# Patient Record
Sex: Male | Born: 1990 | Race: White | Hispanic: No | Marital: Single | Smoking: Never smoker
Health system: Southern US, Community
[De-identification: ages and names within clinical notes are randomized; demographics above are authoritative.]

## PROBLEM LIST (undated history)

## (undated) DIAGNOSIS — N200 Calculus of kidney: Secondary | ICD-10-CM

## (undated) DIAGNOSIS — I10 Essential (primary) hypertension: Secondary | ICD-10-CM

## (undated) HISTORY — PX: EXTRACORPOREAL SHOCK WAVE LITHOTRIPSY: SHX1557

---

## 2011-01-03 ENCOUNTER — Emergency Department: Payer: Self-pay | Admitting: *Deleted

## 2012-03-08 ENCOUNTER — Ambulatory Visit: Payer: Self-pay | Admitting: Urology

## 2012-03-17 ENCOUNTER — Ambulatory Visit: Payer: Self-pay | Admitting: Urology

## 2013-01-31 ENCOUNTER — Emergency Department: Payer: Self-pay | Admitting: Emergency Medicine

## 2013-01-31 LAB — URINALYSIS, COMPLETE
Glucose,UR: NEGATIVE mg/dL (ref 0–75)
Leukocyte Esterase: NEGATIVE
Nitrite: NEGATIVE
Ph: 6 (ref 4.5–8.0)
Specific Gravity: 1.035 (ref 1.003–1.030)

## 2013-01-31 LAB — BASIC METABOLIC PANEL
Anion Gap: 4 — ABNORMAL LOW (ref 7–16)
BUN: 12 mg/dL (ref 7–18)
Glucose: 96 mg/dL (ref 65–99)
Potassium: 4.3 mmol/L (ref 3.5–5.1)
Sodium: 138 mmol/L (ref 136–145)

## 2014-06-03 ENCOUNTER — Ambulatory Visit: Payer: Self-pay | Admitting: Physician Assistant

## 2015-03-06 ENCOUNTER — Encounter: Payer: Self-pay | Admitting: Physician Assistant

## 2015-03-06 ENCOUNTER — Ambulatory Visit: Payer: Self-pay | Admitting: Physician Assistant

## 2015-03-06 VITALS — BP 145/90 | HR 116 | Temp 98.6°F

## 2015-03-06 DIAGNOSIS — J069 Acute upper respiratory infection, unspecified: Secondary | ICD-10-CM

## 2015-03-06 MED ORDER — AMOXICILLIN 875 MG PO TABS
875.0000 mg | ORAL_TABLET | Freq: Two times a day (BID) | ORAL | Status: DC
Start: 2015-03-06 — End: 2015-07-09

## 2015-03-06 NOTE — Progress Notes (Signed)
S: C/o runny nose and congestion for 7-10 days, ? fever, chills, felt hot last night, denies cp/sob, v/d; mucus is yellow and thick, cough is sporadic, c/o of facial and dental pain.   Using otc meds:   O: PE: perrl eomi, normocephalic, tms dull, nasal mucosa red and swollen, throat injected, tonsils swollen b/l, neck supple no lymph, lungs c t a, cv rrr, neuro intact, pt feels warm and clammy  A:  Acute sinusitis   P: amoxil 875mg  bid x 10d, otc meds for fever and cold sx; drink fluids, continue regular meds , return if not improving in 5 days, return earlier if worsening

## 2015-07-09 ENCOUNTER — Encounter: Payer: Self-pay | Admitting: Physician Assistant

## 2015-07-09 ENCOUNTER — Ambulatory Visit: Payer: Self-pay | Admitting: Physician Assistant

## 2015-07-09 VITALS — BP 120/80 | HR 80 | Temp 98.3°F

## 2015-07-09 DIAGNOSIS — N200 Calculus of kidney: Secondary | ICD-10-CM

## 2015-07-09 LAB — POCT URINALYSIS DIPSTICK
BILIRUBIN UA: NEGATIVE
GLUCOSE UA: NEGATIVE
Ketones, UA: NEGATIVE
Leukocytes, UA: NEGATIVE
Nitrite, UA: NEGATIVE
Protein, UA: NEGATIVE
SPEC GRAV UA: 1.025
Urobilinogen, UA: 0.2
pH, UA: 6

## 2015-07-09 MED ORDER — SILODOSIN 8 MG PO CAPS: 8.0000 mg | ORAL_CAPSULE | Freq: Every day | ORAL | Status: AC

## 2015-07-09 MED ORDER — OXYCODONE-ACETAMINOPHEN 5-325 MG PO TABS: 1.0000 | ORAL_TABLET | Freq: Three times a day (TID) | ORAL | Status: AC | PRN

## 2015-07-09 NOTE — Progress Notes (Signed)
S: states is having flank pain due to kidney stone, hx of same, has several large ones that need to pass, passed a piece of one a few days ago, still having pain, is followed by dr Sheppard Pentonwolf, is running out of rapid flow and pain meds  O: vitals wnl, nad, slight flank tenderness, ua 2+ blood  A: kidney stone  P: rapidflow 8mg  qd, percocet 5/325 #20 nr; f/u with dr Sheppard Pentonwolf

## 2015-07-13 ENCOUNTER — Emergency Department: Payer: Managed Care, Other (non HMO)

## 2015-07-13 ENCOUNTER — Encounter: Payer: Self-pay | Admitting: Radiology

## 2015-07-13 ENCOUNTER — Emergency Department
Admission: EM | Admit: 2015-07-13 | Discharge: 2015-07-13 | Disposition: A | Discharge status: Home / Self Care | Payer: Managed Care, Other (non HMO) | Attending: Emergency Medicine | Admitting: Emergency Medicine

## 2015-07-13 DIAGNOSIS — N200 Calculus of kidney: Secondary | ICD-10-CM

## 2015-07-13 DIAGNOSIS — R109 Unspecified abdominal pain: Secondary | ICD-10-CM | POA: Diagnosis present

## 2015-07-13 LAB — CBC WITH DIFFERENTIAL/PLATELET
BASOS ABS: 0.1 10*3/uL (ref 0–0.1)
Basophils Relative: 1 %
Eosinophils Absolute: 0.2 10*3/uL (ref 0–0.7)
Eosinophils Relative: 2 %
HEMATOCRIT: 42.6 % (ref 40.0–52.0)
Hemoglobin: 14.5 g/dL (ref 13.0–18.0)
LYMPHS ABS: 2.8 10*3/uL (ref 1.0–3.6)
LYMPHS PCT: 26 %
MCH: 27.9 pg (ref 26.0–34.0)
MCHC: 33.9 g/dL (ref 32.0–36.0)
MCV: 82.3 fL (ref 80.0–100.0)
MONO ABS: 1.1 10*3/uL — AB (ref 0.2–1.0)
MONOS PCT: 10 %
NEUTROS ABS: 6.7 10*3/uL — AB (ref 1.4–6.5)
Neutrophils Relative %: 61 %
Platelets: 378 10*3/uL (ref 150–440)
RBC: 5.18 MIL/uL (ref 4.40–5.90)
RDW: 13.9 % (ref 11.5–14.5)
WBC: 10.8 10*3/uL — ABNORMAL HIGH (ref 3.8–10.6)

## 2015-07-13 LAB — URINALYSIS COMPLETE WITH MICROSCOPIC (ARMC ONLY)
BILIRUBIN URINE: NEGATIVE
Bacteria, UA: NONE SEEN
GLUCOSE, UA: NEGATIVE mg/dL
KETONES UR: NEGATIVE mg/dL
Leukocytes, UA: NEGATIVE
NITRITE: NEGATIVE
Protein, ur: 30 mg/dL — AB
SPECIFIC GRAVITY, URINE: 1.025 (ref 1.005–1.030)
Squamous Epithelial / LPF: NONE SEEN
pH: 5 (ref 5.0–8.0)

## 2015-07-13 LAB — BASIC METABOLIC PANEL
ANION GAP: 10 (ref 5–15)
BUN: 13 mg/dL (ref 6–20)
CALCIUM: 9 mg/dL (ref 8.9–10.3)
CO2: 21 mmol/L — AB (ref 22–32)
Chloride: 100 mmol/L — ABNORMAL LOW (ref 101–111)
Creatinine, Ser: 0.85 mg/dL (ref 0.61–1.24)
GFR calc Af Amer: >60 mL/min (ref >60)
GFR calc non Af Amer: >60 mL/min (ref >60)
GLUCOSE: 96 mg/dL (ref 65–99)
Potassium: 3.7 mmol/L (ref 3.5–5.1)
Sodium: 131 mmol/L — ABNORMAL LOW (ref 135–145)

## 2015-07-13 MED ORDER — MORPHINE SULFATE (PF) 4 MG/ML IV SOLN
4.0000 mg | Freq: Once | INTRAVENOUS | Status: AC
  Administered 2015-07-13: 4 mg via INTRAVENOUS
  Filled 2015-07-13: qty 1

## 2015-07-13 MED ORDER — ONDANSETRON HCL 4 MG/2ML IJ SOLN
4.0000 mg | Freq: Once | INTRAMUSCULAR | Status: AC
  Administered 2015-07-13: 4 mg via INTRAVENOUS
  Filled 2015-07-13: qty 2

## 2015-07-13 MED ORDER — ONDANSETRON HCL 4 MG PO TABS: 4.0000 mg | ORAL_TABLET | Freq: Three times a day (TID) | ORAL | Status: DC | PRN

## 2015-07-13 MED ORDER — SODIUM CHLORIDE 0.9 % IV BOLUS (SEPSIS)
1000.0000 mL | Freq: Once | INTRAVENOUS | Status: AC
  Administered 2015-07-13: 1000 mL via INTRAVENOUS

## 2015-07-13 NOTE — Discharge Instructions (Signed)
Please seek medical attention for any high fevers, chest pain, shortness of breath, change in behavior, persistent vomiting, bloody stool or any other new or concerning symptoms. ° ° °Kidney Stones °Kidney stones (urolithiasis) are solid masses that form inside your kidneys. The intense pain is caused by the stone moving through the kidney, ureter, bladder, and urethra (urinary tract). When the stone moves, the ureter starts to spasm around the stone. The stone is usually passed in your pee (urine).  °HOME CARE °· Drink enough fluids to keep your pee clear or pale yellow. This helps to get the stone out. °· Take a 24-hour pee (urine) sample as told by your doctor. You may need to take another sample every 6-12 months. °· Strain all pee through the provided strainer. Do not pee without peeing through the strainer, not even once. If you pee the stone out, catch it in the strainer. The stone may be as small as a grain of salt. Take this to your doctor. This will help your doctor figure out what you can do to try to prevent more kidney stones. °· Only take medicine as told by your doctor. °· Make changes to your daily diet as told by your doctor. You may be told to: °¨ Limit how much salt you eat. °¨ Eat 5 or more servings of fruits and vegetables each day. °¨ Limit how much meat, poultry, fish, and eggs you eat. °· Keep all follow-up visits as told by your doctor. This is important. °· Get follow-up X-rays as told by your doctor. °GET HELP IF: °You have pain that gets worse even if you have been taking pain medicine. °GET HELP RIGHT AWAY IF:  °· Your pain does not get better with medicine. °· You have a fever or shaking chills. °· Your pain increases and gets worse over 18 hours. °· You have new belly (abdominal) pain. °· You feel faint or pass out. °· You are unable to pee. °  °This information is not intended to replace advice given to you by your health care provider. Make sure you discuss any questions you have  with your health care provider. °  °Document Released: 09/09/2007 Document Revised: 12/12/2014 Document Reviewed: 08/24/2012 °Elsevier Interactive Patient Education ©2016 [employer] ° °

## 2015-07-13 NOTE — ED Notes (Signed)
Pt transported to xray 

## 2015-07-13 NOTE — ED Provider Notes (Signed)
[facility] Emergency Department Provider Note    ____________________________________________  Time seen: ~0730  I have reviewed the triage vital signs and the nursing notes.   HISTORY  Chief Complaint Flank Pain   History limited by: Not Limited   HPI Steve Atkinson is a 25 y.o. male history of kidney stones who presents to the emergency department today with left flank pain. It started 6 days ago. It has been located in the left flank. It comes and goes. He saw a walk-in clinic and was given pain medication and Rapaflo. He states that this morning he started having nausea and vomiting was not able to keep his pain medications down. He states he has had multiple kidney stones in the past. Sees Dr. Evelene Croon with urology. He states the past 2 stones have required lithotripsy. He denies any fevers.   No past medical history on file.  There are no active problems to display for this patient.   No past surgical history on file.  Current Outpatient Rx  Name  Route  Sig  Dispense  Refill  . oxyCODONE-acetaminophen (ROXICET) 5-325 MG tablet   Oral   Take 1 tablet by mouth every 8 (eight) hours as needed for severe pain.   20 tablet   0     rx written   . silodosin (RAPAFLO) 8 MG CAPS capsule   Oral   Take 1 capsule (8 mg total) by mouth daily with breakfast.   30 capsule   5     rx written   . tapentadol (NUCYNTA) 50 MG TABS tablet   Oral   Take 50 mg by mouth.           Allergies Review of patient's allergies indicates no known allergies.  No family history on file.  Social History Social History  Substance Use Topics  . Smoking status: Never Smoker   . Smokeless tobacco: Not on file  . Alcohol Use: Not on file  Works in the [employer].   Review of Systems  Constitutional: Negative for fever. Cardiovascular: Negative for chest pain. Respiratory: Negative for shortness of breath. Gastrointestinal: Positive for left  flank pain Neurological: Negative for headaches, focal weakness or numbness.  10-point ROS otherwise negative.  ____________________________________________   PHYSICAL EXAM:  VITAL SIGNS: ED Triage Vitals  Enc Vitals Group     BP 07/13/15 0706 138/76 mmHg     Pulse Rate 07/13/15 0706 79     Resp 07/13/15 0706 18     Temp 07/13/15 0706 98.1 F (36.7 C)     Temp Source 07/13/15 0706 Oral     SpO2 07/13/15 0706 96 %     Weight 07/13/15 0706 240 lb (108.863 kg)     Height 07/13/15 0706  (1.753 m)     Head Cir --      Peak Flow --      Pain Score 07/13/15 0708 8   Constitutional: Alert and oriented. Well appearing and in no distress. Eyes: Conjunctivae are normal. PERRL. Normal extraocular movements. ENT   Head: Normocephalic and atraumatic.   Nose: No congestion/rhinnorhea.   Mouth/Throat: Mucous membranes are moist.   Neck: No stridor. Hematological/Lymphatic/Immunilogical: No cervical lymphadenopathy. Cardiovascular: Normal rate, regular rhythm.  No murmurs, rubs, or gallops. Respiratory: Normal respiratory effort without tachypnea nor retractions. Breath sounds are clear and equal bilaterally. No wheezes/rales/rhonchi. Gastrointestinal: Soft and nontender. No distention.  Genitourinary: Deferred Musculoskeletal: Normal range of motion in all extremities. No joint effusions.  No lower extremity tenderness nor edema. Neurologic:  Normal speech and language. No gross focal neurologic deficits are appreciated.  Skin:  Skin is warm, dry and intact. No rash noted. Psychiatric: Mood and affect are normal. Speech and behavior are normal. Patient exhibits appropriate insight and judgment.  ____________________________________________    LABS (pertinent positives/negatives)  Labs Reviewed  URINALYSIS COMPLETEWITH MICROSCOPIC ([facility] ONLY) - Abnormal; Notable for the following:    Color, Urine YELLOW (*)    APPearance CLEAR (*)    Hgb urine dipstick 3+ (*)     Protein, ur 30 (*)    All other components within normal limits  CBC WITH DIFFERENTIAL/PLATELET - Abnormal; Notable for the following:    WBC 10.8 (*)    Neutro Abs 6.7 (*)    Monocytes Absolute 1.1 (*)    All other components within normal limits  BASIC METABOLIC PANEL - Abnormal; Notable for the following:    Sodium 131 (*)    Chloride 100 (*)    CO2 21 (*)    All other components within normal limits     ____________________________________________   EKG  None  ____________________________________________    RADIOLOGY  KUB IMPRESSION: 1. Suggested tiny stones in the kidneys. No ureteral stones Identified.  CT renal IMPRESSION: There is a 3.2 mm stone in the distal left ureter resulting in minimal hydronephrosis. Other stones are seen in both kidneys with 3 on the left and 1 on the right.   ____________________________________________   PROCEDURES  Procedure(s) performed: None  Critical Care performed: No  ____________________________________________   INITIAL IMPRESSION / ASSESSMENT AND PLAN / ED COURSE  Pertinent labs & imaging results that were available during my care of the patient were reviewed by me and considered in my medical decision making (see chart for details). Patient presented to the emergency department today because of left flank pain. Patient does have a history of kidney stones and feels this is similar. Saw urgent care earlier in the week and had a urinalysis done which showed multiple red blood cells. KUB here did not show any ureteral stone. I did have a discussion with the patient about risk and benefits of CT scan. Patient didn't want a CT scan be performed to evaluate for ureteral stone. I discussed risk of cancer, patient verbalizes understanding and wish to proceed with CT scan. CT scan showed a roughly 3.2 mm stone. I discussed with the patient that this should be able to pass on its own. He can follow up with urology. Will give  patient antibiotics. Patient still has Percocet from urgent care visit.  ____________________________________________   FINAL CLINICAL IMPRESSION(S) / ED DIAGNOSES  Final diagnoses:  Left flank pain  Kidney stone      [name], MD 07/13/15 [phone]

## 2015-07-13 NOTE — ED Notes (Signed)
Pt reports left flank pain that started Monday. Pt was seen Monday and diagnosed with Kidney stone. Unable to keep pain mediations down due to vomiting.

## 2015-07-15 ENCOUNTER — Encounter: Payer: Self-pay | Admitting: Physician Assistant

## 2015-07-15 ENCOUNTER — Ambulatory Visit: Payer: Self-pay | Admitting: Physician Assistant

## 2015-07-15 VITALS — BP 130/80 | HR 75 | Temp 98.2°F

## 2015-07-15 DIAGNOSIS — N200 Calculus of kidney: Secondary | ICD-10-CM

## 2015-07-15 NOTE — Progress Notes (Signed)
S: c/o flank pain, seen in ER for flank pain on Saturday, ct showed multiple kidney stones, was seen in clinic on Thursday for same given rapaflo and percocet, pt is still having intermittent pain and ?if can get pain medication  O:vitals wnl, nad, lungs c t a, cv rrr  A: kidney stone  P: f/u with urology, unable to rx more pain medications due to clinic guidelines; reviewed ct report with patient, has not gotten any other pain meds from another provider [place]

## 2015-07-30 NOTE — Addendum Note (Signed)
Addended by: Catha BrowEACON, MONIQUE T on: 07/30/2015 03:05 PM   Modules accepted: Orders

## 2015-07-31 ENCOUNTER — Encounter: Payer: Self-pay | Admitting: *Deleted

## 2015-08-01 ENCOUNTER — Encounter
Admission: RE | Disposition: A | Discharge status: Home / Self Care | Payer: Self-pay | Source: Ambulatory Visit | Attending: Urology

## 2015-08-01 ENCOUNTER — Encounter: Payer: Self-pay | Admitting: *Deleted

## 2015-08-01 ENCOUNTER — Ambulatory Visit
Admission: RE | Admit: 2015-08-01 | Discharge: 2015-08-01 | Disposition: A | Discharge status: Home / Self Care | Payer: Managed Care, Other (non HMO) | Source: Ambulatory Visit | Attending: Urology | Admitting: Urology

## 2015-08-01 DIAGNOSIS — F17200 Nicotine dependence, unspecified, uncomplicated: Secondary | ICD-10-CM | POA: Diagnosis not present

## 2015-08-01 DIAGNOSIS — N200 Calculus of kidney: Secondary | ICD-10-CM | POA: Insufficient documentation

## 2015-08-01 DIAGNOSIS — Z79899 Other long term (current) drug therapy: Secondary | ICD-10-CM | POA: Diagnosis not present

## 2015-08-01 DIAGNOSIS — Z8249 Family history of ischemic heart disease and other diseases of the circulatory system: Secondary | ICD-10-CM | POA: Insufficient documentation

## 2015-08-01 HISTORY — DX: Essential (primary) hypertension: I10

## 2015-08-01 HISTORY — PX: EXTRACORPOREAL SHOCK WAVE LITHOTRIPSY: SHX1557

## 2015-08-01 HISTORY — DX: Calculus of kidney: N20.0

## 2015-08-01 SURGERY — LITHOTRIPSY, ESWL
Anesthesia: Moderate Sedation | Laterality: Left

## 2015-08-01 MED ORDER — FUROSEMIDE 10 MG/ML IJ SOLN: 10.0000 mg | Freq: Once | INTRAMUSCULAR | Status: DC

## 2015-08-01 MED ORDER — MIDAZOLAM HCL 2 MG/2ML IJ SOLN
INTRAMUSCULAR | Status: AC
  Administered 2015-08-01: 1 mg via INTRAMUSCULAR
  Filled 2015-08-01: qty 2

## 2015-08-01 MED ORDER — DIPHENHYDRAMINE HCL 25 MG PO CAPS
ORAL_CAPSULE | ORAL | Status: AC
  Administered 2015-08-01: 25 mg via ORAL
  Filled 2015-08-01: qty 1

## 2015-08-01 MED ORDER — MIDAZOLAM HCL 2 MG/2ML IJ SOLN
1.0000 mg | Freq: Once | INTRAMUSCULAR | Status: AC
  Administered 2015-08-01: 1 mg via INTRAMUSCULAR

## 2015-08-01 MED ORDER — MORPHINE SULFATE (PF) 10 MG/ML IV SOLN
10.0000 mg | Freq: Once | INTRAVENOUS | Status: AC
  Administered 2015-08-01: 10 mg via INTRAMUSCULAR

## 2015-08-01 MED ORDER — NUCYNTA 50 MG PO TABS: 50.0000 mg | ORAL_TABLET | Freq: Four times a day (QID) | ORAL | Status: DC | PRN

## 2015-08-01 MED ORDER — DIPHENHYDRAMINE HCL 25 MG PO CAPS
25.0000 mg | ORAL_CAPSULE | ORAL | Status: AC
  Administered 2015-08-01: 25 mg via ORAL

## 2015-08-01 MED ORDER — NUCYNTA 50 MG PO TABS: 50.0000 mg | ORAL_TABLET | Freq: Four times a day (QID) | ORAL | Status: AC | PRN

## 2015-08-01 MED ORDER — LEVOFLOXACIN 500 MG PO TABS: 500.0000 mg | ORAL_TABLET | Freq: Every day | ORAL | Status: DC

## 2015-08-01 MED ORDER — LEVOFLOXACIN 500 MG PO TABS: 500.0000 mg | ORAL_TABLET | Freq: Every day | ORAL | Status: AC

## 2015-08-01 MED ORDER — DEXTROSE-NACL 5-0.45 % IV SOLN
INTRAVENOUS | Status: DC
  Administered 2015-08-01: 11:00:00 via INTRAVENOUS

## 2015-08-01 MED ORDER — LEVOFLOXACIN 500 MG PO TABS
ORAL_TABLET | ORAL | Status: AC
  Administered 2015-08-01: 500 mg via ORAL
  Filled 2015-08-01: qty 1

## 2015-08-01 MED ORDER — ONDANSETRON 8 MG PO TBDP: 8.0000 mg | ORAL_TABLET | Freq: Four times a day (QID) | ORAL | Status: DC | PRN

## 2015-08-01 MED ORDER — MORPHINE SULFATE (PF) 10 MG/ML IV SOLN
INTRAVENOUS | Status: AC
  Administered 2015-08-01: 10 mg via INTRAMUSCULAR
  Filled 2015-08-01: qty 1

## 2015-08-01 MED ORDER — ONDANSETRON 8 MG PO TBDP: 8.0000 mg | ORAL_TABLET | Freq: Four times a day (QID) | ORAL | Status: AC | PRN

## 2015-08-01 MED ORDER — LEVOFLOXACIN 500 MG PO TABS
500.0000 mg | ORAL_TABLET | ORAL | Status: AC
  Administered 2015-08-01: 500 mg via ORAL

## 2015-08-01 MED ORDER — ONDANSETRON 4 MG PO TBDP
8.0000 mg | ORAL_TABLET | Freq: Once | ORAL | Status: AC
  Administered 2015-08-01: 8 mg via ORAL
  Filled 2015-08-01: qty 2

## 2015-08-01 NOTE — Discharge Instructions (Signed)
Dietary Guidelines to Help Prevent Kidney Stones °Your risk of kidney stones can be decreased by adjusting the foods you eat. The most important thing you can do is drink enough fluid. You should drink enough fluid to keep your urine clear or pale yellow. The following guidelines provide specific information for the type of kidney stone you have had. °GUIDELINES ACCORDING TO TYPE OF KIDNEY STONE °Calcium Oxalate Kidney Stones °· Reduce the amount of salt you eat. Foods that have a lot of salt cause your body to release excess calcium into your urine. The excess calcium can combine with a substance called oxalate to form kidney stones. °· Reduce the amount of animal protein you eat if the amount you eat is excessive. Animal protein causes your body to release excess calcium into your urine. Ask your dietitian how much protein from animal sources you should be eating. °· Avoid foods that are high in oxalates. If you take vitamins, they should have less than 500 mg of vitamin C. Your body turns vitamin C into oxalates. You do not need to avoid fruits and vegetables high in vitamin C. °Calcium Phosphate Kidney Stones °· Reduce the amount of salt you eat to help prevent the release of excess calcium into your urine. °· Reduce the amount of animal protein you eat if the amount you eat is excessive. Animal protein causes your body to release excess calcium into your urine. Ask your dietitian how much protein from animal sources you should be eating. °· Get enough calcium from food or take a calcium supplement (ask your dietitian for recommendations). Food sources of calcium that do not increase your risk of kidney stones include: °¨ Broccoli. °¨ Dairy products, such as cheese and yogurt. °¨ Pudding. °Uric Acid Kidney Stones °· Do not have more than 6 oz of animal protein per day. °FOOD SOURCES °Animal Protein Sources °· Meat (all types). °· Poultry. °· Eggs. °· Fish, seafood. °Foods High in Salt °· Salt seasonings. °· Soy  sauce. °· Teriyaki sauce. °· Cured and processed meats. °· Salted crackers and snack foods. °· Fast food. °· Canned soups and most canned foods. °Foods High in Oxalates °· Grains: °¨ Amaranth. °¨ Barley. °¨ Grits. °¨ Wheat germ. °¨ Bran. °¨ Buckwheat flour. °¨ All bran cereals. °¨ Pretzels. °¨ Whole wheat bread. °· Vegetables: °¨ Beans (wax). °¨ Beets and beet greens. °¨ Collard greens. °¨ Eggplant. °¨ Escarole. °¨ Leeks. °¨ Okra. °¨ Parsley. °¨ Rutabagas. °¨ Spinach. °¨ Swiss chard. °¨ Tomato paste. °¨ Fried potatoes. °¨ Sweet potatoes. °· Fruits: °¨ Red currants. °¨ Figs. °¨ Kiwi. °¨ Rhubarb. °· Meat and Other Protein Sources: °¨ Beans (dried). °¨ Soy burgers and other soybean products. °¨ Miso. °¨ Nuts (peanuts, almonds, pecans, cashews, hazelnuts). °¨ Nut butters. °¨ Sesame seeds and tahini (paste made of sesame seeds). °¨ Poppy seeds. °· Beverages: °¨ Chocolate drink mixes. °¨ Soy milk. °¨ Instant iced tea. °¨ Juices made from high-oxalate fruits or vegetables. °· Other: °¨ Carob. °¨ Chocolate. °¨ Fruitcake. °¨ Marmalades. °  °This information is not intended to replace advice given to you by your health care provider. Make sure you discuss any questions you have with your health care provider. °  °Document Released: 07/18/2010 Document Revised: 03/28/2013 Document Reviewed: 02/17/2013 °Elsevier Interactive Patient Education ©2016 [employer] ° ° °Kidney Stones °Kidney stones (urolithiasis) are deposits that form inside your kidneys. The intense pain is caused by the stone moving through the urinary tract. When the stone moves, the   ureter goes into spasm around the stone. The stone is usually passed in the urine.  °CAUSES  °· A disorder that makes certain neck glands produce too much parathyroid hormone (primary hyperparathyroidism). °· A buildup of uric acid crystals, similar to gout in your joints. °· Narrowing (stricture) of the ureter. °· A kidney obstruction present at birth (congenital  obstruction). °· Previous surgery on the kidney or ureters. °· Numerous kidney infections. °SYMPTOMS  °· Feeling sick to your stomach (nauseous). °· Throwing up (vomiting). °· Blood in the urine (hematuria). °· Pain that usually spreads (radiates) to the groin. °· Frequency or urgency of urination. °DIAGNOSIS  °· Taking a history and physical exam. °· Blood or urine tests. °· CT scan. °· Occasionally, an examination of the inside of the urinary bladder (cystoscopy) is performed. °TREATMENT  °· Observation. °· Increasing your fluid intake. °· Extracorporeal shock wave lithotripsy--This is a noninvasive procedure that uses shock waves to break up kidney stones. °· Surgery may be needed if you have severe pain or persistent obstruction. There are various surgical procedures. Most of the procedures are performed with the use of small instruments. Only small incisions are needed to accommodate these instruments, so recovery time is minimized. °The size, location, and chemical composition are all important variables that will determine the proper choice of action for you. Talk to your health care provider to better understand your situation so that you will minimize the risk of injury to yourself and your kidney.  °HOME CARE INSTRUCTIONS  °· Drink enough water and fluids to keep your urine clear or pale yellow. This will help you to pass the stone or stone fragments. °· Strain all urine through the provided strainer. Keep all particulate matter and stones for your health care provider to see. The stone causing the pain may be as small as a grain of salt. It is very important to use the strainer each and every time you pass your urine. The collection of your stone will allow your health care provider to analyze it and verify that a stone has actually passed. The stone analysis will often identify what you can do to reduce the incidence of recurrences. °· Only take over-the-counter or prescription medicines for pain,  discomfort, or fever as directed by your health care provider. °· Keep all follow-up visits as told by your health care provider. This is important. °· Get follow-up X-rays if required. The absence of pain does not always mean that the stone has passed. It may have only stopped moving. If the urine remains completely obstructed, it can cause loss of kidney function or even complete destruction of the kidney. It is your responsibility to make sure X-rays and follow-ups are completed. Ultrasounds of the kidney can show blockages and the status of the kidney. Ultrasounds are not associated with any radiation and can be performed easily in a matter of minutes. °· Make changes to your daily diet as told by your health care provider. You may be told to: °¨ Limit the amount of salt that you eat. °¨ Eat 5 or more servings of fruits and vegetables each day. °¨ Limit the amount of meat, poultry, fish, and eggs that you eat. °· Collect a 24-hour urine sample as told by your health care provider. You may need to collect another urine sample every 6-12 months. °SEEK MEDICAL CARE IF: °· You experience pain that is progressive and unresponsive to any pain medicine you have been prescribed. °SEEK IMMEDIATE MEDICAL CARE IF:  °·   Pain cannot be controlled with the prescribed medicine. °· You have a fever or shaking chills. °· The severity or intensity of pain increases over 18 hours and is not relieved by pain medicine. °· You develop a new onset of abdominal pain. °· You feel faint or pass out. °· You are unable to urinate. °  °This information is not intended to replace advice given to you by your health care provider. Make sure you discuss any questions you have with your health care provider. °  °Document Released: 03/23/2005 Document Revised: 12/12/2014 Document Reviewed: 08/24/2012 °Elsevier Interactive Patient Education ©2016 [employer] ° °Lithotripsy, Care After °Refer to this sheet in the next few weeks. These instructions  provide you with information on caring for yourself after your procedure. Your health care provider may also give you more specific instructions. Your treatment has been planned according to current medical practices, but problems sometimes occur. Call your health care provider if you have any problems or questions after your procedure. °WHAT TO EXPECT AFTER THE PROCEDURE  °· Your urine may have a red tinge for a few days after treatment. Blood loss is usually minimal. °· You may have soreness in the back or flank area. This usually goes away after a few days. The procedure can cause blotches or bruises on the back where the pressure wave enters the skin. These marks usually cause only minimal discomfort and should disappear in a short time. °· Stone fragments should begin to pass within 24 hours of treatment. However, a delayed passage is not unusual. °· You may have pain, discomfort, and feel sick to your stomach (nauseated) when the crushed fragments of stone are passed down the tube from the kidney to the bladder. Stone fragments can pass soon after the procedure and may last for up to 4-8 weeks. °· A small number of patients may have severe pain when stone fragments are not able to pass, which leads to an obstruction. °· If your stone is greater than 1 inch (2.5 cm) in diameter or if you have multiple stones that have a combined diameter greater than 1 inch (2.5 cm), you may require more than one treatment. °· If you had a stent placed prior to your procedure, you may experience some discomfort, especially during urination. You may experience the pain or discomfort in your flank or back, or you may experience a sharp pain or discomfort at the base of your penis or in your lower abdomen. The discomfort usually lasts only a few minutes after urinating. °HOME CARE INSTRUCTIONS  °· Rest at home until you feel your energy improving. °· Only take over-the-counter or prescription medicines for pain, discomfort, or  fever as directed by your health care provider. Depending on the type of lithotripsy, you may need to take antibiotics and anti-inflammatory medicines for a few days. °· Drink enough water and fluids to keep your urine clear or pale yellow. This helps "flush" your kidneys. It helps pass any remaining pieces of stone and prevents stones from coming back. °· Most people can resume daily activities within 1-2 days after standard lithotripsy. It can take longer to recover from laser and percutaneous lithotripsy. °· Strain all urine through the provided strainer. Keep all particulate matter and stones for your health care provider to see. The stone may be as small as a grain of salt. It is very important to use the strainer each and every time you pass your urine. Any stones that are found can be sent   to a medical lab for examination. °· Visit your health care provider for a follow-up appointment in a few weeks. Your doctor may remove your stent if you have one. Your health care provider will also check to see whether stone particles still remain. °SEEK MEDICAL CARE IF:  °· Your pain is not relieved by medicine. °· You have a lasting nauseous feeling. °· You feel there is too much blood in the urine. °· You develop persistent problems with frequent or painful urination that does not at least partially improve after 2 days following the procedure. °· You have a congested cough. °· You feel lightheaded. °· You develop a rash or any other signs that might suggest an allergic problem. °· You develop any reaction or side effects to your medicine(s). °SEEK IMMEDIATE MEDICAL CARE IF:  °· You experience severe back or flank pain or both. °· You see nothing but blood when you urinate. °· You cannot pass any urine at all. °· You have a fever or shaking chills. °· You develop shortness of breath, difficulty breathing, or chest pain. °· You develop vomiting that will not stop after 6-8 hours. °· You have a fainting episode. °  °This  information is not intended to replace advice given to you by your health care provider. Make sure you discuss any questions you have with your health care provider. °  °Document Released: 04/12/2007 Document Revised: 12/12/2014 Document Reviewed: 10/06/2012 °Elsevier Interactive Patient Education ©2016 [employer] ° °Lithotripsy °Lithotripsy is a treatment that can sometimes help eliminate kidney stones and pain that they cause. A form of lithotripsy, also known as extracorporeal shock wave lithotripsy, is a nonsurgical procedure that helps your body rid itself of the kidney stone when it is too big to pass on its own. Extracorporeal shock wave lithotripsy is a method of crushing a kidney stone with shock waves. These shock waves pass through your body and are focused on your stone. They cause the kidney stones to crumble while still in the urinary tract. It is then easier for the smaller pieces of stone to pass in the urine. °Lithotripsy usually takes about an hour. It is done in a hospital, a lithotripsy center, or a mobile unit. It usually does not require an overnight stay. Your health care provider will instruct you on preparation for the procedure. Your health care provider will tell you what to expect afterward. °LET YOUR HEALTH CARE PROVIDER KNOW ABOUT: °· Any allergies you have. °· All medicines you are taking, including vitamins, herbs, eye drops, creams, and over-the-counter medicines. °· Previous problems you or members of your family have had with the use of anesthetics. °· Any blood disorders you have. °· Previous surgeries you have had. °· Medical conditions you have. °RISKS AND COMPLICATIONS °Generally, lithotripsy for kidney stones is a safe procedure. However, as with any procedure, complications can occur. Possible complications include: °· Infection. °· Bleeding of the kidney. °· Bruising of the kidney or skin. °· Obstruction of the ureter. °· Failure of the stone to fragment. °BEFORE THE  PROCEDURE °· Do not eat or drink for 6-8 hours prior to the procedure. You may, however, take the medications with a sip of water that your physician instructs you to take °· Do not take aspirin or aspirin-containing products for 7 days prior to your procedure °· Do not take nonsteroidal anti-inflammatory products for 7 days prior to your procedure °PROCEDURE °A stent (flexible tube with holes) may be placed in your ureter. The ureter   is the tube that transports the urine from the kidneys to the bladder. Your health care provider may place a stent before the procedure. This will help keep urine flowing from the kidney if the fragments of the stone block the ureter. You may have an IV tube placed in one of your veins to give you fluids and medicines. These medicines may help you relax or make you sleep. During the procedure, you will lie comfortably on a fluid-filled cushion or in a warm-water bath. After an X-ray or ultrasound exam to locate your stone, shock waves are aimed at the stone. If you are awake, you may feel a tapping sensation as the shock waves pass through your body. If large stone particles remain after treatment, a second procedure may be necessary at a later date. °For comfort during the test: °· Relax as much as possible. °· Try to remain still as much as possible. °· Try to follow instructions to speed up the test. °· Let your health care provider know if you are uncomfortable, anxious, or in pain. °AFTER THE PROCEDURE  °After surgery, you will be taken to the recovery area. A nurse will watch and check your progress. Once you're awake, stable, and taking fluids well, you will be allowed to go home as long as there are no problems. You will also be allowed to pass your urine before discharge. You may be given antibiotics to help prevent infection. You may also be prescribed pain medicine if needed. In a week or two, your health care provider may remove your stent, if you have one. You may first  have an X-ray exam to check on how successful the fragmentation of your stone has been and how much of the stone has passed. Your health care provider will check to see whether or not stone particles remain. °SEEK IMMEDIATE MEDICAL CARE IF: °· You develop a fever or shaking chills. °· Your pain is not relieved by medicine. °· You feel sick to your stomach (nauseated) and you vomit. °· You develop heavy bleeding. °· You have difficulty urinating. °· You start to pass your stent from your penis. °  °This information is not intended to replace advice given to you by your health care provider. Make sure you discuss any questions you have with your health care provider. °  °Document Released: 03/20/2000 Document Revised: 04/13/2014 Document Reviewed: 10/06/2012 °Elsevier Interactive Patient Education ©2016 [employer] ° °

## 2017-02-01 IMAGING — CR DG ABDOMEN 1V
2 series · 2 of 2 positions shown · non-contrast
Comparison: March 17, 2012 both kidneys are largely obscured by
bowel contents.

CLINICAL DATA: Left flank pain for 6 days

EXAM:
ABDOMEN - 1 VIEW

[abdomen kub (1 of 2)]
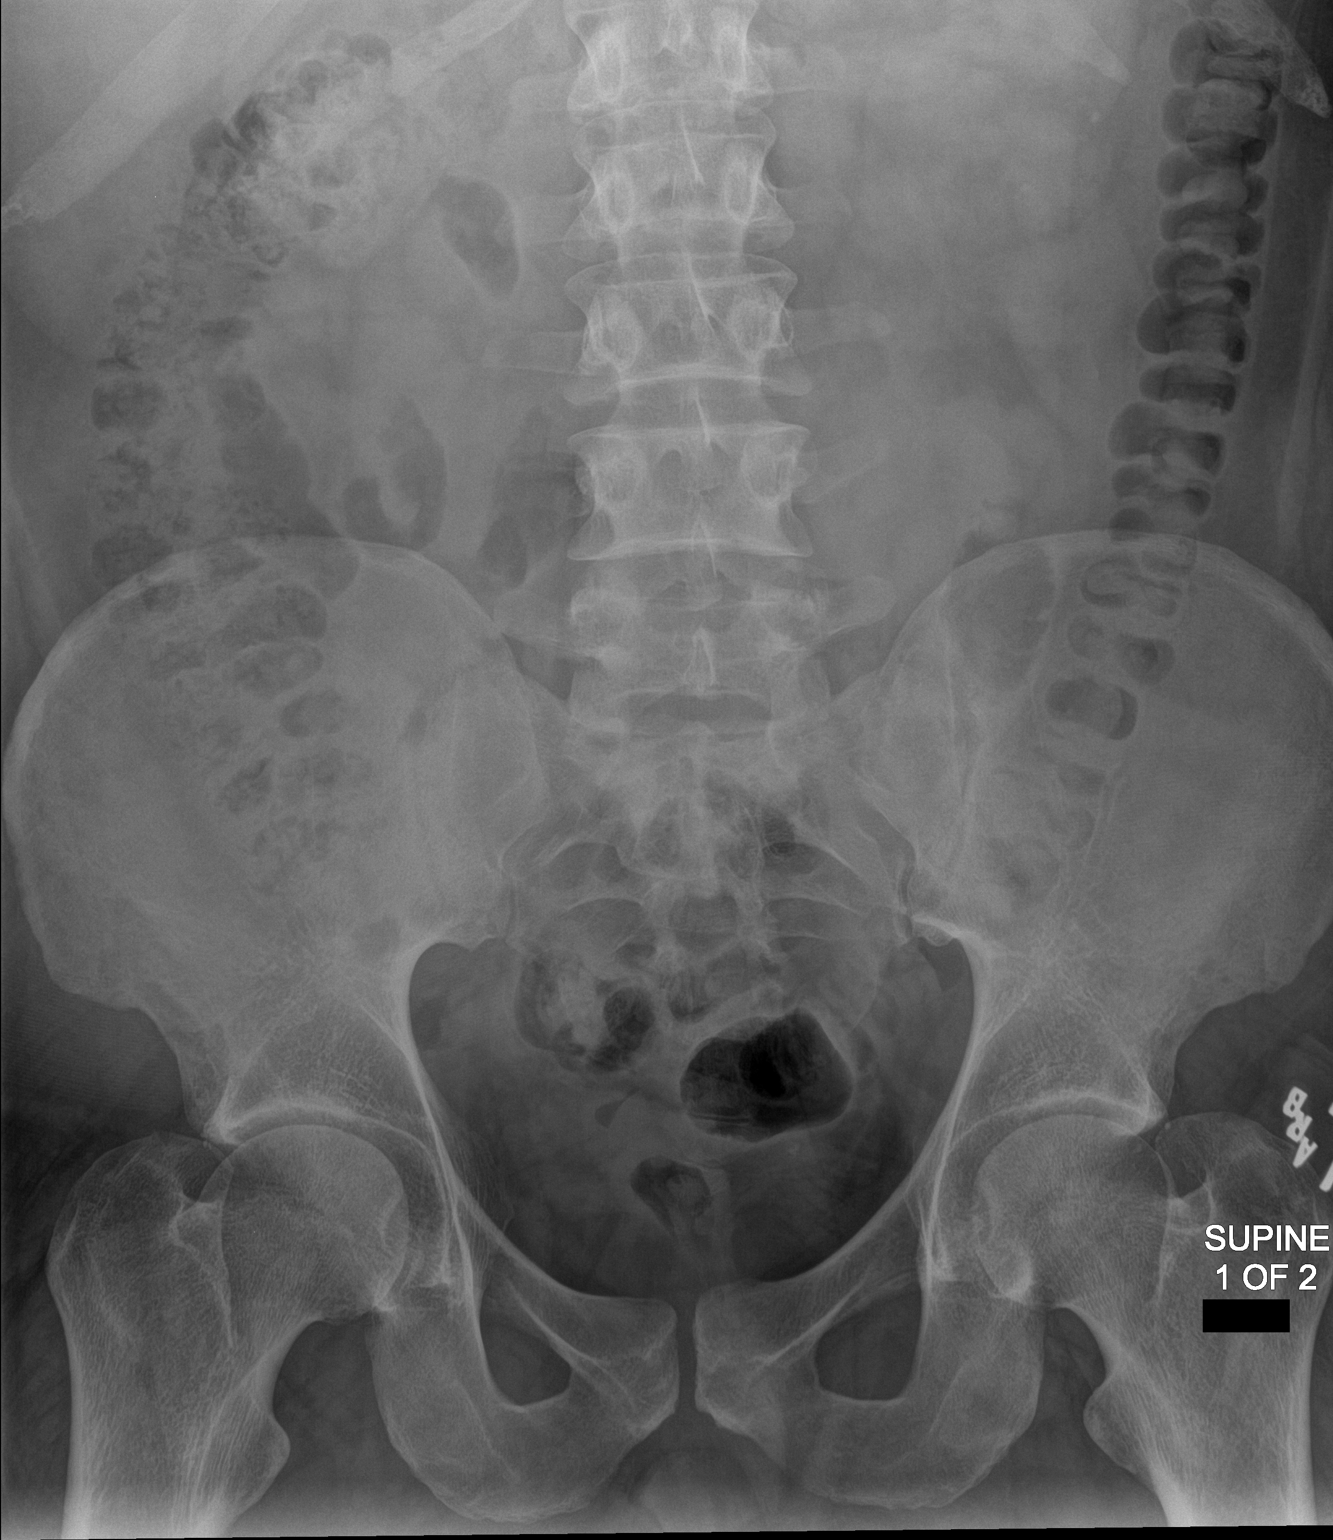

[abdomen kub (2 of 2)]
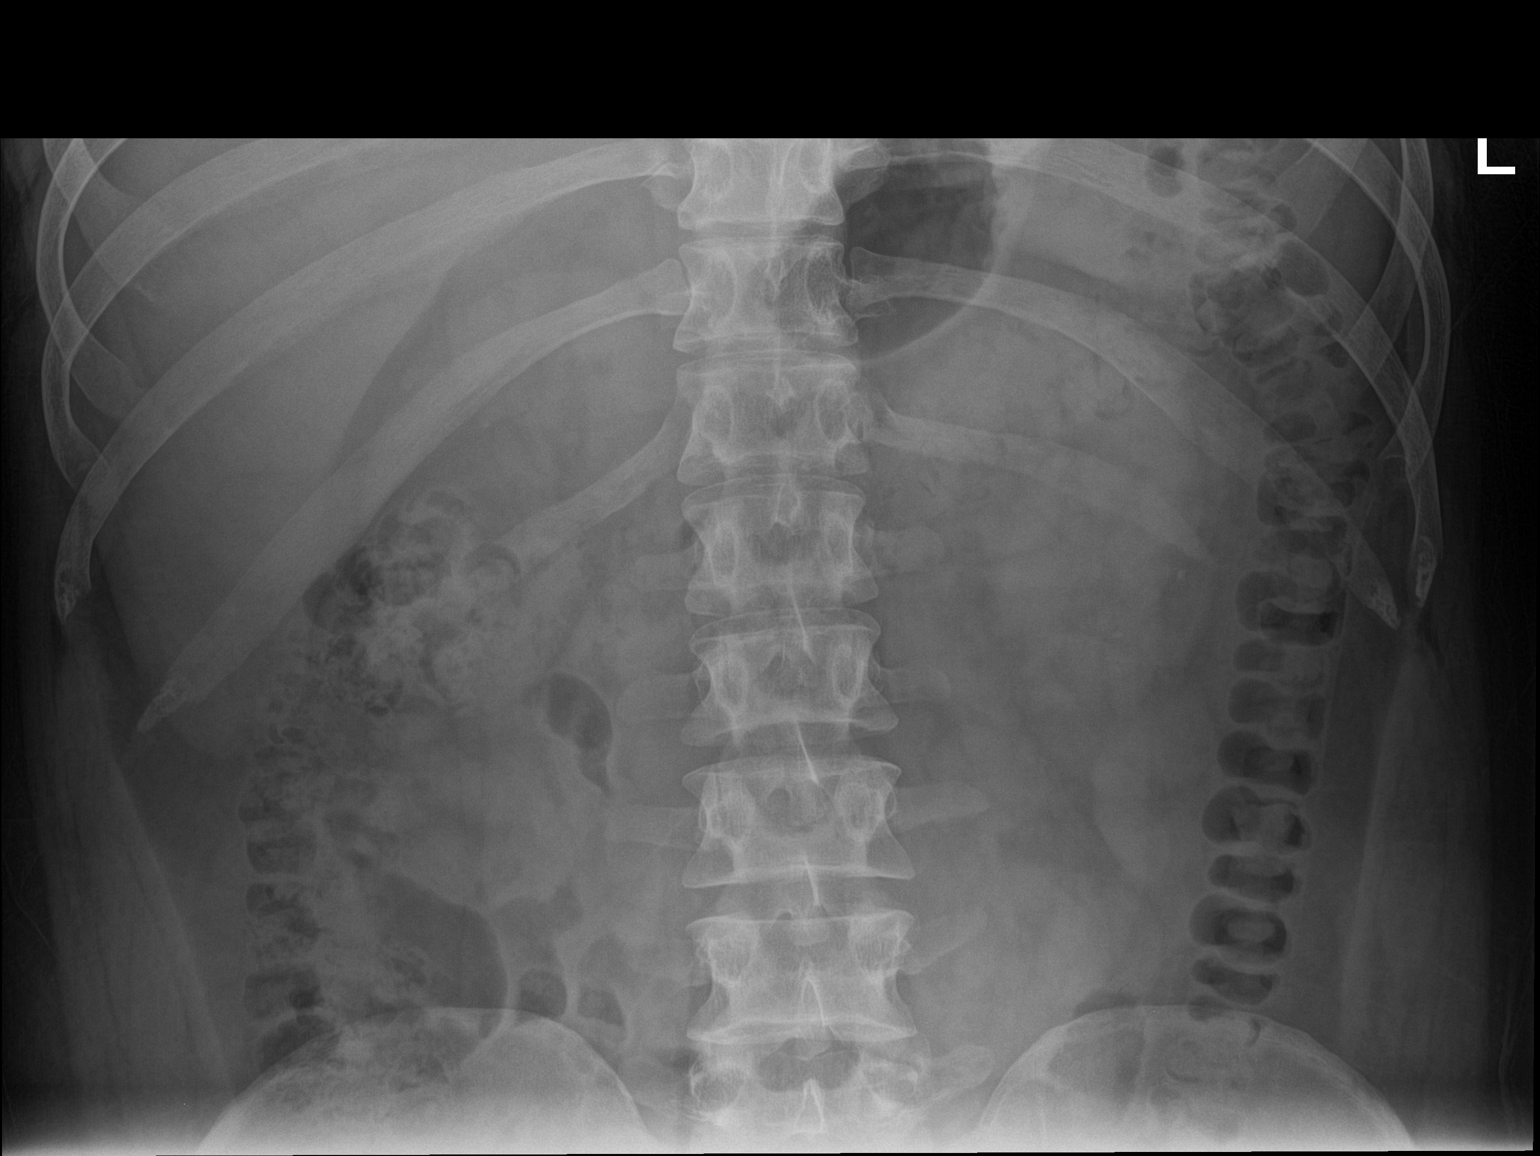

[2 of 2 positions shown; findings below may reference images not displayed]

FINDINGS: Tiny punctate calcifications project over the upper pole of the
right kidney in the lower pole the left kidney. Whether these are
renal stones or bowel contents is unclear but stones are not
excluded. No ureteral stones are seen. No other acute abnormalities.
IMPRESSION: 1. Suggested tiny stones in the kidneys. No ureteral stones
identified.

## 2017-02-01 IMAGING — CT CT RENAL STONE PROTOCOL
1 of 2 series · 15 of 32 positions shown, 19 images · non-contrast
Comparison: January 31, 2013

CLINICAL DATA: Left flank pain.  Known renal stones.

EXAM:
CT ABDOMEN AND PELVIS WITHOUT CONTRAST
TECHNIQUE: Multidetector CT imaging of the abdomen and pelvis was performed
following the standard protocol without IV contrast.

[Series 2: stone standard full · axial · 0.88mm/px · z∈[-1142,-657]mm · 15 of 107 slices shown, 19 images]
[im 5/107  soft-tissue]
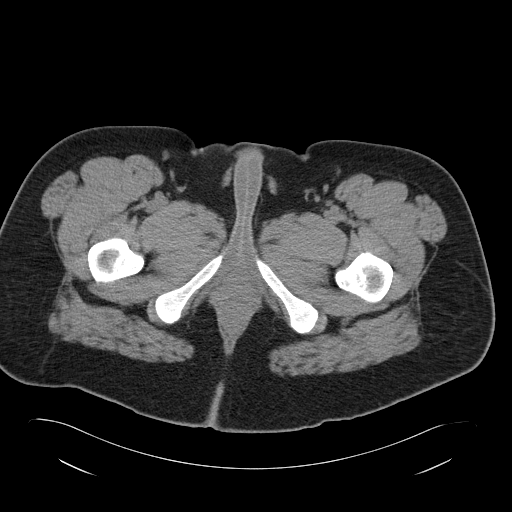
[im 5/107  bone]
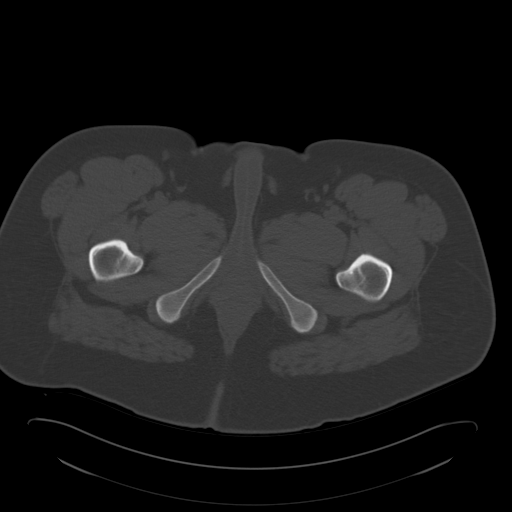
[im 14/107  soft-tissue]
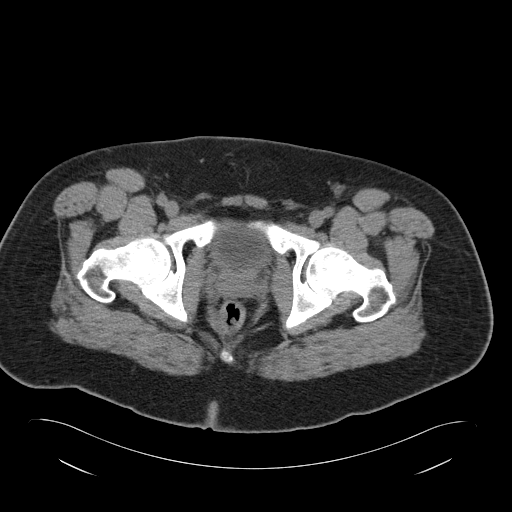
[im 23/107  soft-tissue]
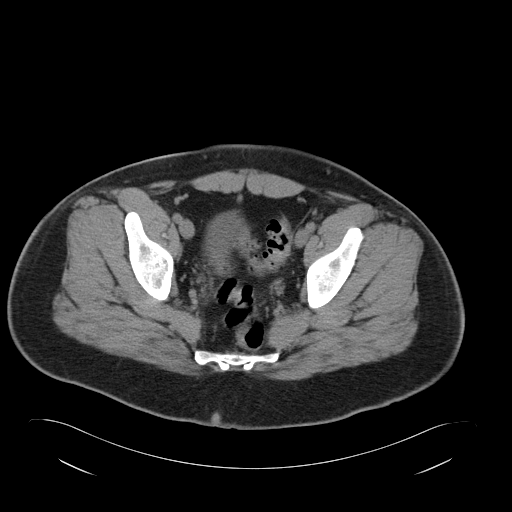
[im 31/107  soft-tissue]
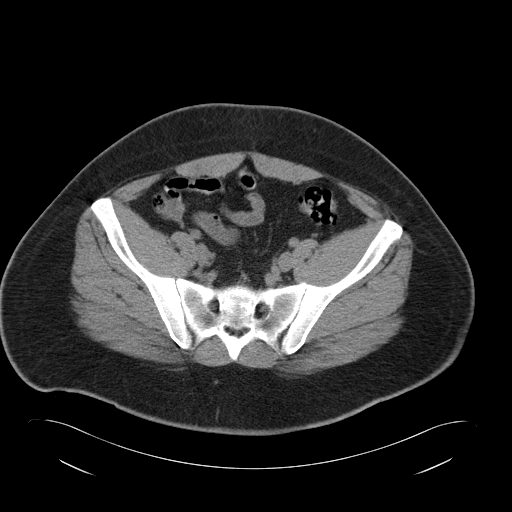
[im 36/107  soft-tissue]
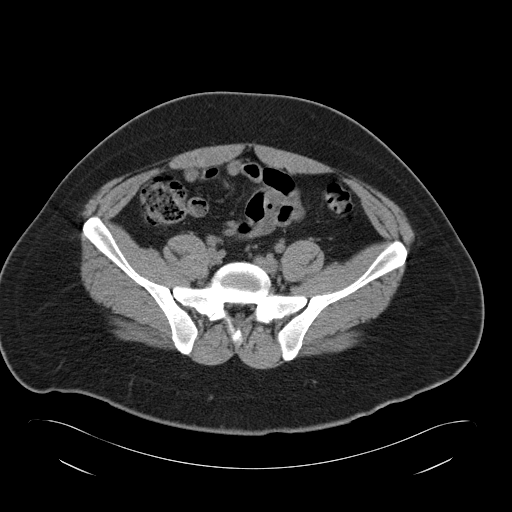
[im 45/107  soft-tissue]
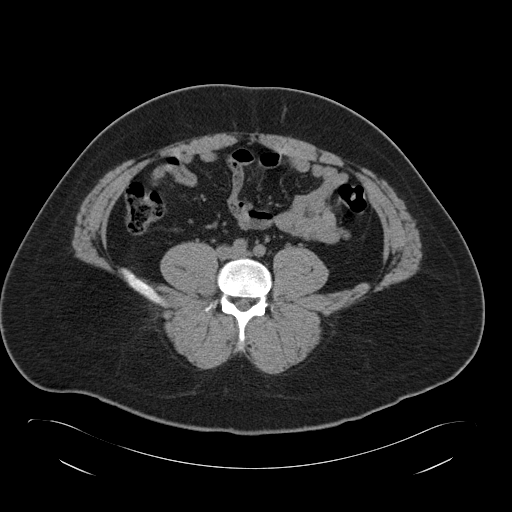
[im 54/107  soft-tissue]
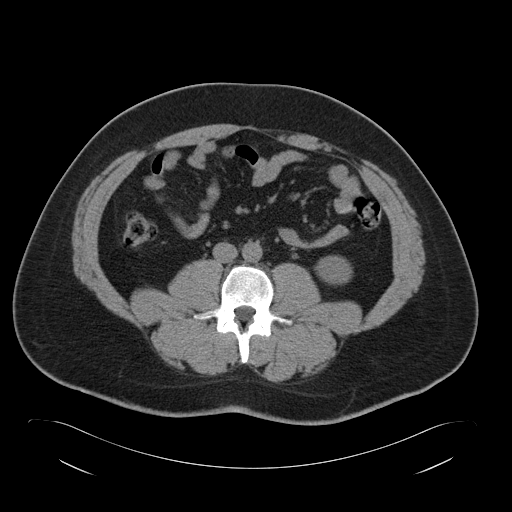
[im 62/107  soft-tissue]
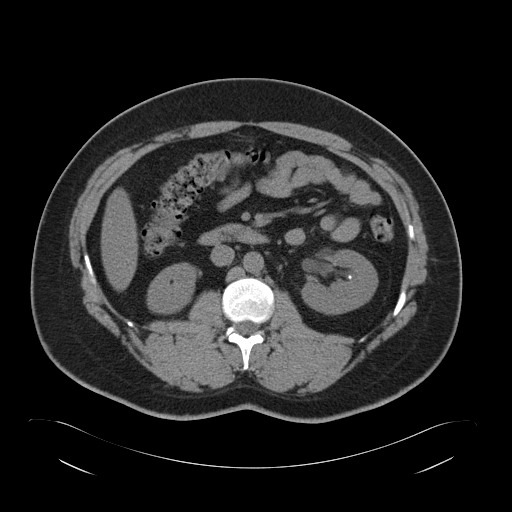
[im 71/107  soft-tissue]
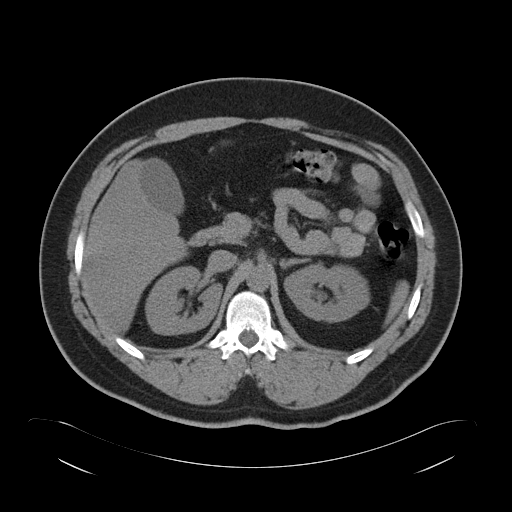
[im 71/107  bone]
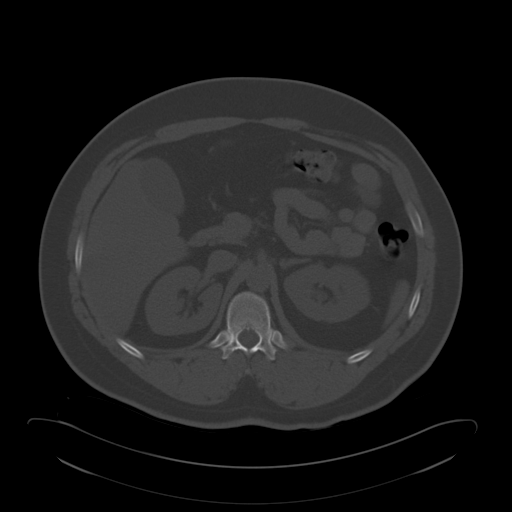
[im 76/107  soft-tissue]
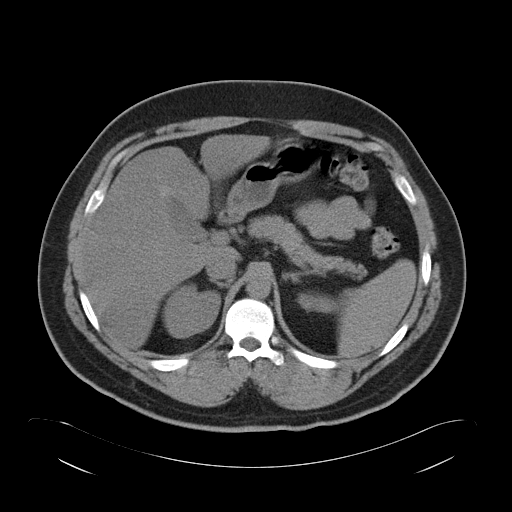
[im 84/107  soft-tissue]
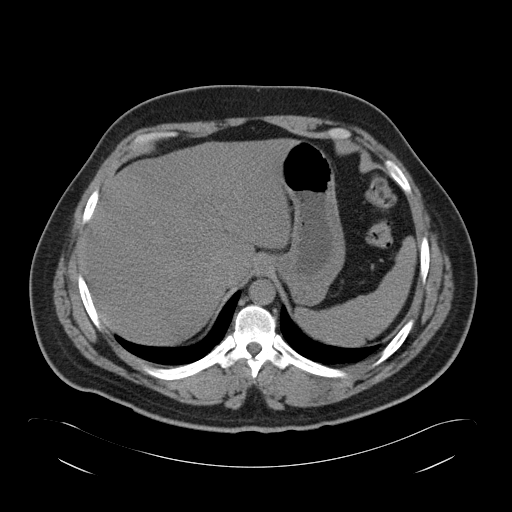
[im 89/107  lung]
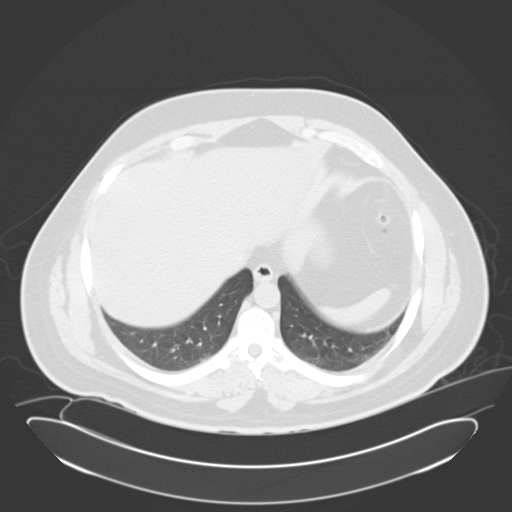
[im 93/107  soft-tissue]
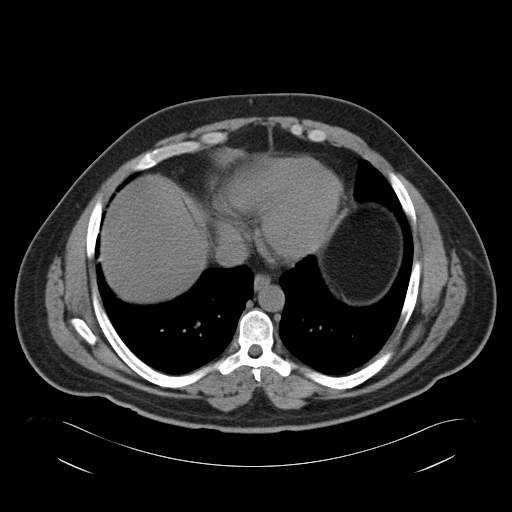
[im 93/107  lung]
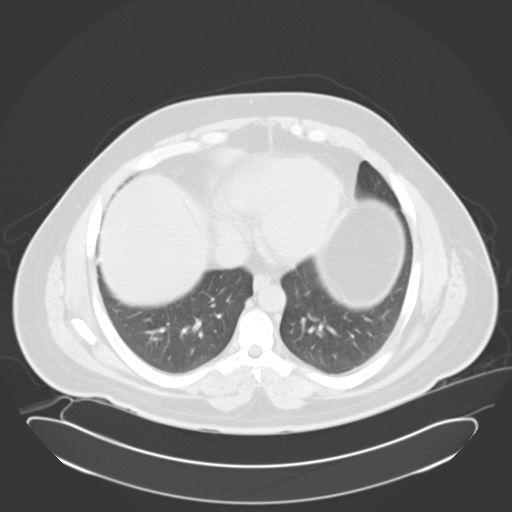
[im 98/107  lung]
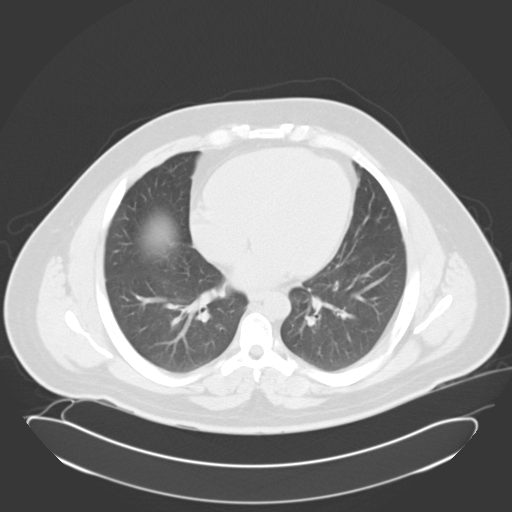
[im 102/107  soft-tissue]
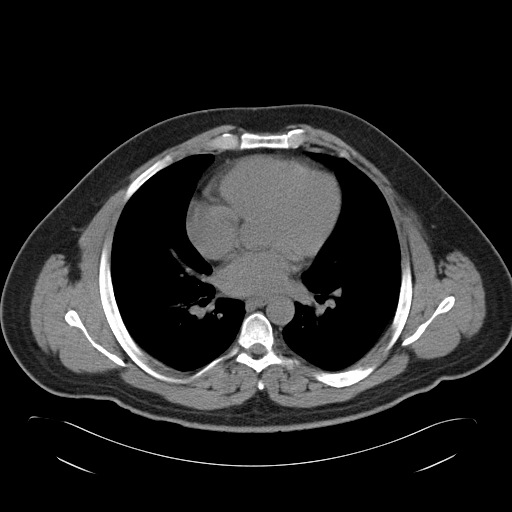
[im 102/107  lung]
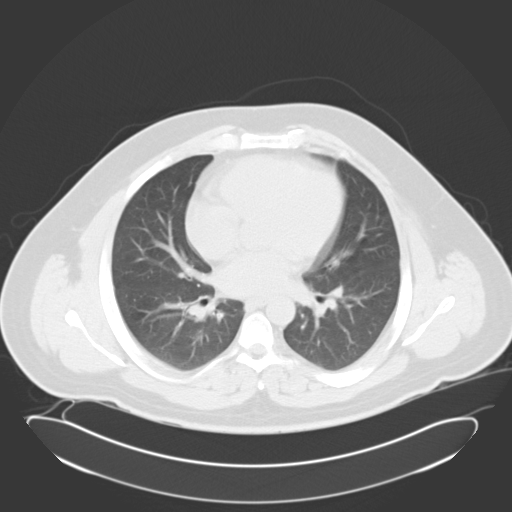

[15 of 32 positions shown; findings below may reference images not displayed]

FINDINGS: Normal lung bases.

No free air or free fluid. A single 2 mm stone is seen in the mid
right kidney with no hydronephrosis or perinephric stranding. At
least 3 stones are seen in the left kidney with the largest
measuring 4.6 mm in the midpole. There is mild prominence of the
left renal calices compared to the right. No significant perinephric
stranding. The left ureter is slightly larger in caliber than the
right but not dilated. There is a stone however in the distal left
ureter measuring 3.2 mm, just above the UVJ.

Hepatic steatosis is seen. The liver, gallbladder, spleen, adrenal
glands, and pancreas are normal. The stomach and small bowel are
unremarkable. The colon and appendix are normal. No aneurysm or
adenopathy.

The pelvis demonstrates no adenopathy. The prostate, seminal
vesicles, and bladder are normal. A 3.2 mm stone is seen in the
distal left ureter.

Visualized bones are normal.
IMPRESSION: There is a 3.2 mm stone in the distal left ureter resulting in
minimal hydronephrosis. Other stones are seen in both kidneys with 3
on the left and 1 on the right.
